# Patient Record
Sex: Male | Born: 1950 | Race: White | Marital: Married | Smoking: Never smoker
Health system: Southern US, Community
[De-identification: ages and names within clinical notes are randomized; demographics above are authoritative.]

## PROBLEM LIST (undated history)

## (undated) DIAGNOSIS — I10 Essential (primary) hypertension: Secondary | ICD-10-CM

## (undated) DIAGNOSIS — E7800 Pure hypercholesterolemia, unspecified: Secondary | ICD-10-CM

## (undated) HISTORY — DX: Pure hypercholesterolemia, unspecified: E78.00

## (undated) HISTORY — DX: Essential (primary) hypertension: I10

## (undated) HISTORY — PX: FRACTURE SURGERY: SHX138

## (undated) HISTORY — PX: SPLENECTOMY, TOTAL: SHX788

## (undated) HISTORY — PX: APPENDECTOMY: SHX54

## (undated) HISTORY — PX: HERNIA REPAIR: SHX51

## (undated) HISTORY — PX: EYE SURGERY: SHX253

---

## 2012-11-25 ENCOUNTER — Ambulatory Visit (INDEPENDENT_AMBULATORY_CARE_PROVIDER_SITE_OTHER): Payer: BC Managed Care – PPO | Admitting: Family Medicine

## 2012-11-25 VITALS — BP 139/73 | HR 68 | Temp 98.7°F | Resp 17 | Ht 74.5 in | Wt 230.0 lb

## 2012-11-25 DIAGNOSIS — A779 Spotted fever, unspecified: Secondary | ICD-10-CM

## 2012-11-25 DIAGNOSIS — L0291 Cutaneous abscess, unspecified: Secondary | ICD-10-CM

## 2012-11-25 DIAGNOSIS — R52 Pain, unspecified: Secondary | ICD-10-CM

## 2012-11-25 MED ORDER — DOXYCYCLINE HYCLATE 100 MG PO TABS: 100.0000 mg | ORAL_TABLET | Freq: Two times a day (BID) | ORAL | Status: DC

## 2012-11-25 NOTE — Progress Notes (Signed)
  [facility]:  Office Visit  Chief Complaint:  Chief Complaint  Patient presents with  . Boil on neck    2 days    HPI: Corey Craig is a 61 y.o. male who complains of 2-4 days h/o boil on neck. Denies fevers, chills. Was infected hair, mashed it out and tried to get out the pus but would not break open. Friday and Saturday hot and tight and today is smaller. Has not had any drainage, has not tried warm compresses. Tried Advil with some relief.  History reviewed. No pertinent past medical history. Past Surgical History  Procedure Date  . Appendectomy   . Cholecystectomy   . Hernia repair   . Fracture surgery    History   Social History  . Marital Status: Married    Spouse Name: N/A    Number of Children: N/A  . Years of Education: N/A   Social History Main Topics  . Smoking status: Never Smoker   . Smokeless tobacco: None  . Alcohol Use: Yes  . Drug Use: No  . Sexually Active: Yes    Birth Control/ Protection: None   Other Topics Concern  . None   Social History Narrative  . None   History reviewed. No pertinent family history. No Known Allergies Prior to Admission medications   Medication Sig Start Date End Date Taking? Authorizing Provider  atorvastatin (LIPITOR) 20 MG tablet Take 20 mg by mouth daily.   Yes Historical Provider, MD  lisinopril (PRINIVIL,ZESTRIL) 20 MG tablet Take 20 mg by mouth daily.   Yes Historical Provider, MD     ROS: The patient denies fevers, chills, night sweats, unintentional weight loss, chest pain, palpitations, wheezing, dyspnea on exertion, nausea, vomiting, abdominal pain, dysuria, hematuria, melena, numbness, weakness, or tingling.   All other systems have been reviewed and were otherwise negative with the exception of those mentioned in the HPI and as above.    PHYSICAL EXAM: Filed Vitals:   11/25/12 1341  BP: 139/73  Pulse: 68  Temp: 98.7 F (37.1 C)  Resp: 17   Filed Vitals:   11/25/12 1341   Height: 6' 2.5" (1.892 m)  Weight: 230 lb (104.327 kg)   Body mass index is 29.14 kg/(m^2).  General: Alert, no acute distress HEENT:  Normocephalic, atraumatic, oropharynx patent.  Cardiovascular:  Regular rate and rhythm, no rubs murmurs or gallops.  No Carotid bruits, radial pulse intact. No pedal edema.  Respiratory: Clear to auscultation bilaterally.  No wheezes, rales, or rhonchi.  No cyanosis, no use of accessory musculature GI: No organomegaly, abdomen is soft and non-tender, positive bowel sounds.  No masses. Skin: + erythema, warm, 2.5 x 3.5  Cm lesion on left parspinal neck msk. Neurologic: Facial musculature symmetric. Psychiatric: Patient is appropriate throughout our interaction. Lymphatic: No cervical lymphadenopathy Musculoskeletal: Gait intact.   LABS: No results found for this or any previous visit.   EKG/XRAY:   Primary read interpreted by Dr. Conley Rolls at [facility].   ASSESSMENT/PLAN: Encounter Diagnosis  Name Primary?  . Cellulitis and abscess Yes   Rx Doxycycline 100 mg BID x 10 days Wound cx pending Wound care and f/u as directed Tylenol and/or Advil prn F/u prn     ,  PHUONG, DO 11/25/2012 2:37 PM

## 2012-11-25 NOTE — Progress Notes (Signed)
Patient ID: Corey Craig MRN: [id number], DOB: Jul 31, 1951, 61 y.o. Date of Encounter: 11/25/2012, 3:12 PM    PROCEDURE NOTE: Verbal consent obtained. Betadine prep per usual protocol. Local anesthesia obtained with 2% lidocaine plain.  1 cm incision made with 11 blade along lesion.  Culture taken. Copious sebaceous material expressed. Lesion explored revealing no loculations. Packed with 1/4 plain packing.  Dressed. Wound care instructions including precautions with patient. Patient tolerated the procedure well. Recheck in 72 hours.      [name], PA-C 11/25/2012 3:12 PM

## 2012-11-28 ENCOUNTER — Ambulatory Visit (INDEPENDENT_AMBULATORY_CARE_PROVIDER_SITE_OTHER): Payer: BC Managed Care – PPO | Admitting: Physician Assistant

## 2012-11-28 ENCOUNTER — Telehealth: Payer: Self-pay | Admitting: Radiology

## 2012-11-28 VITALS — BP 122/76 | HR 80 | Temp 98.1°F | Resp 16 | Ht 74.5 in | Wt 236.2 lb

## 2012-11-28 DIAGNOSIS — L723 Sebaceous cyst: Secondary | ICD-10-CM

## 2012-11-28 LAB — WOUND CULTURE
Gram Stain: NONE SEEN
Gram Stain: NONE SEEN
Gram Stain: NONE SEEN
Organism ID, Bacteria: NO GROWTH

## 2012-11-28 NOTE — Telephone Encounter (Signed)
Message copied by Caffie Damme on Thu Nov 28, 2012  9:48 AM ------      Message from: Hamilton Capri P      Created: Wed Nov 27, 2012  2:50 PM       Wound culture negative, continue with doxycycline. F/u on Thursday

## 2012-11-28 NOTE — Telephone Encounter (Signed)
Called patient to advise left message

## 2012-11-28 NOTE — Progress Notes (Signed)
Patient ID: JAMAURI KRUZEL MRN: [id number], DOB: [birth year] 61 y.o. Date of Encounter: 11/28/2012, 4:05 PM  Chief Complaint: Wound care   See previous note  HPI: 61 y.o. y/o male presents for wound care s/p I&D on 11/26/12 Doing well No issues or complaints Afebrile/ no chills No nausea or vomiting Tolerating doxycycline. Pain improved. States area is still hard with a palpable lump. Daily dressing change Previous note reviewed  No past medical history on file.   Home Meds: Prior to Admission medications   Medication Sig Start Date End Date Taking? Authorizing Provider  atorvastatin (LIPITOR) 20 MG tablet Take 20 mg by mouth daily.   Yes Historical Provider, MD  doxycycline (VIBRA-TABS) 100 MG tablet Take 1 tablet (100 mg total) by mouth 2 (two) times daily. 11/25/12  Yes Thao P Le, DO  lisinopril (PRINIVIL,ZESTRIL) 20 MG tablet Take 20 mg by mouth daily.   Yes Historical Provider, MD    Allergies: No Known Allergies  ROS: Constitutional: Afebrile, no chills Cardiovascular: negative for chest pain or palpitations Dermatological: Positive for wound. Negative for erythema, pain, or warmth.   GI: No nausea or vomiting   EXAM: Physical Exam: Blood pressure 122/76, pulse 80, temperature 98.1 F (36.7 C), temperature source Oral, resp. rate 16, height 6' 2.5" (1.892 m), weight 236 lb 3.2 oz (107.14 kg), SpO2 96.00%., Body mass index is 29.92 kg/(m^2). General: Well developed, well nourished, in no acute distress. Nontoxic appearing. Head: Normocephalic, atraumatic, sclera non-icteric.  Neck: Supple. Lungs: Breathing is unlabored. Heart: Normal rate. Skin:  Warm and moist. Dressing and packing in place. Minimal surrounding induration. No erythema or tenderness to palpation. Neuro: Alert and oriented X 3. Moves all extremities spontaneously. Normal gait.  Psych:  Responds to questions appropriately with a normal affect.       PROCEDURE: Dressing removed. No packing due  to it had fallen out yesterday.  Expressed copious purulent drainage and sebaceous material.  Wound bed healthy Irrigated with 1% plain lidocaine 5 cc. Repacked with 1/4 plain packing. Dressing applied  LAB: Culture: no growth.  A/P: 61 y.o. y/o male with neck sebaceous cyst above s/p I&D on 11/26/12. Wound care per above Continue doxycycline. Pain well controlled Daily dressing changes Recheck 48 hours if needed.   [name], PA-C 11/28/2012 4:05 PM

## 2012-11-30 ENCOUNTER — Ambulatory Visit (INDEPENDENT_AMBULATORY_CARE_PROVIDER_SITE_OTHER): Payer: BC Managed Care – PPO | Admitting: Physician Assistant

## 2012-11-30 VITALS — BP 153/77 | HR 78 | Temp 98.5°F | Resp 17 | Ht 74.5 in | Wt 233.0 lb

## 2012-11-30 DIAGNOSIS — L0211 Cutaneous abscess of neck: Secondary | ICD-10-CM

## 2012-11-30 DIAGNOSIS — L03221 Cellulitis of neck: Secondary | ICD-10-CM

## 2012-11-30 NOTE — Progress Notes (Signed)
  Subjective:    Patient ID: Corey Craig, male    DOB: [birth year], 61 y.o.   MRN: [id number]  HPI 61 yr old CM presents for a recheck of an abscess that was I&D 11/25/2012.  He says no longer painful and not draining now.  He has not had any fevers.  Review of Systems  All other systems reviewed and are negative.       Objective:   Physical Exam  Nursing note and vitals reviewed. Constitutional: He appears well-developed and well-nourished.  Cardiovascular: Normal rate.   Pulmonary/Chest: Effort normal.  Skin:       Back of neck, bandaid removed. No active draining or purulence expressed. No induration or erythema. There is still swelling and a little fluctuance, but it is non-tender.        Assessment & Plan:  Abscess-nape of neck-much improved. Continue/finish antibiotics.

## 2016-02-28 ENCOUNTER — Ambulatory Visit (HOSPITAL_COMMUNITY): Payer: BLUE CROSS/BLUE SHIELD

## 2016-02-28 ENCOUNTER — Other Ambulatory Visit: Payer: Self-pay | Admitting: Internal Medicine

## 2016-02-28 DIAGNOSIS — R42 Dizziness and giddiness: Secondary | ICD-10-CM

## 2016-03-03 ENCOUNTER — Other Ambulatory Visit: Payer: Self-pay

## 2016-03-05 ENCOUNTER — Ambulatory Visit
Admission: RE | Admit: 2016-03-05 | Discharge: 2016-03-05 | Disposition: A | Discharge status: Home / Self Care | Payer: BLUE CROSS/BLUE SHIELD | Source: Ambulatory Visit | Attending: Internal Medicine | Admitting: Internal Medicine

## 2016-03-05 DIAGNOSIS — R42 Dizziness and giddiness: Secondary | ICD-10-CM

## 2016-10-10 ENCOUNTER — Encounter: Payer: Self-pay | Admitting: Neurology

## 2016-10-10 ENCOUNTER — Ambulatory Visit (INDEPENDENT_AMBULATORY_CARE_PROVIDER_SITE_OTHER): Payer: BLUE CROSS/BLUE SHIELD | Admitting: Neurology

## 2016-10-10 VITALS — BP 150/94 | HR 66 | Ht 76.0 in | Wt 231.8 lb

## 2016-10-10 DIAGNOSIS — R42 Dizziness and giddiness: Secondary | ICD-10-CM

## 2016-10-10 NOTE — Progress Notes (Signed)
Reason for visit: Vertigo  Referring physician: Dr. Delice Lesch Corey Craig is a 65 y.o. male  History of present illness:  Corey Craig is a 65 year old right-handed white male with a history of onset of dizziness that occurred around [place] in March 2017. The patient was watching TV, he stood up and had onset of vertigo suddenly. He sat down, but he did not feel completely normal for about 30 minutes. The patient went to take a shower, and felt dizziness again associated with vertigo, over the next 2 weeks, he had some vertigo intermittently associated with sweats at times. The patient did not have overt nausea or vomiting. The patient would note particularly that if he turns his head side to side, he would get dizzy. He states still he felt better. The patient was seen by his primary care physician, he was given meclizine, but he could not tolerate the side effects and he stopped the medication. MRI of the brain was done, this was unremarkable. The patient reported no other associated symptoms of headache, neck discomfort, ear pain, changes in hearing, ringing in the ears. The patient denied any numbness or weakness of the face, arms, or legs. The patient denied any double vision, loss of vision, blurred vision, changes in speech or swallowing, or syncope. The patient utilized the Epley maneuvers, this seemed to help his symptoms, and he continued to improve until about 2 months ago when he plateaued with the dizziness. The patient may have good days and bad days with his symptoms, at times he feels slightly off balance when he looks down with walking. Other days, he feels normal. The patient is sent to this office for an evaluation. The patient denies any medication changes around the time of onset of symptoms. He may have had a cold around the time of symptom onset.  Past Medical History:  Diagnosis Date  . High cholesterol   . Hypertension     Past Surgical History:    Procedure Laterality Date  . APPENDECTOMY    . EYE SURGERY    . FRACTURE SURGERY    . HERNIA REPAIR    . SPLENECTOMY, TOTAL      Family History  Problem Relation Age of Onset  . Hypertension Mother   . Breast cancer Mother   . Heart disease Father     Social history:  reports that he has never smoked. He has quit using smokeless tobacco. He reports that he drinks alcohol. He reports that he does not use drugs.  Medications:  Prior to Admission medications   Medication Sig Start Date End Date Taking? Authorizing Provider  Ascorbic Acid (VITAMIN C) 250 MG CHEW Chew 1 tablet by mouth daily.   Yes Historical Provider, MD  atorvastatin (LIPITOR) 40 MG tablet Take 40 mg by mouth daily. 09/12/16  Yes Historical Provider, MD  Cholecalciferol (VITAMIN D3) 2000 units TABS Take 1 tablet by mouth daily.   Yes Historical Provider, MD  lisinopril (PRINIVIL,ZESTRIL) 10 MG tablet Take 10 mg by mouth daily. 09/12/16  Yes Historical Provider, MD  Multiple Vitamin (MULTIVITAMIN) tablet Take 1 tablet by mouth daily.   Yes Historical Provider, MD     No Known Allergies  ROS:  Out of a complete 14 system review of symptoms, the patient complains only of the following symptoms, and all other reviewed systems are negative.  Dizziness, vertigo  Blood pressure (!) 150/94, pulse 66, height 6\' 4"  (1.93 m), weight 231 lb 12 oz (  105.1 kg).  Physical Exam  General: The patient is alert and cooperative at the time of the examination.  Eyes: Pupils are equal, round, and reactive to light. Discs are flat bilaterally.  Neck: The neck is supple, no carotid bruits are noted.  Ears: Tympanic membranes are clear bilaterally.  Respiratory: The respiratory examination is clear.  Cardiovascular: The cardiovascular examination reveals a regular rate and rhythm, no obvious murmurs or rubs are noted.  Skin: Extremities are without significant edema.  Neurologic Exam  Mental status: The patient is alert and  oriented x 3 at the time of the examination. The patient has apparent normal recent and remote memory, with an apparently normal attention span and concentration ability.  Cranial nerves: Facial symmetry is present. There is good sensation of the face to pinprick and soft touch bilaterally. The strength of the facial muscles and the muscles to head turning and shoulder shrug are normal bilaterally. Speech is well enunciated, no aphasia or dysarthria is noted. Extraocular movements are full. Visual fields are full. The tongue is midline, and the patient has symmetric elevation of the soft palate. No obvious hearing deficits are noted.  Motor: The motor testing reveals 5 over 5 strength of all 4 extremities. Good symmetric motor tone is noted throughout.  Sensory: Sensory testing is intact to pinprick, soft touch, vibration sensation, and position sense on all 4 extremities. No evidence of extinction is noted.  Coordination: Cerebellar testing reveals good finger-nose-finger and heel-to-shin bilaterally. The Nyan-Barrany procedure reveals some bilateral horizontal and rotatory end-gaze nystagmus, minimal symptoms noted by the patient.  Gait and station: Gait is normal. Tandem gait is normal. Romberg is negative. No drift is seen.  Reflexes: Deep tendon reflexes are symmetric and normal bilaterally. Toes are downgoing bilaterally.   MRI brain 03/05/16:  IMPRESSION: 1.  No acute intracranial abnormality. 2. Generalized intracranial artery dolichoectasia, raising possibility of chronic hypertension. 3. Otherwise normal for age noncontrast MRI appearance of the brain.  * MRI scan images were reviewed online. I agree with the written report.    Assessment/Plan:  1. Vertigo  The patient appears to have symptoms consistent with an inner ear process. The patient may have had a mild vestibulitis or positional vertigo. The patient still has some ongoing symptoms, the Epley maneuvers have been  helpful, this should be maintained 2 or 3 times a week to continue to help symptoms. The patient is almost back to his baseline, but not quite. There is no evidence of a central nervous system process. I would not recommend any medication for the patient at this time. He will follow-up through this office if needed.  Jill Alexanders MD 10/10/2016 8:44 AM  [facility] [address] [place], [place] [address]  Phone [phone] Fax [phone]

## 2018-03-14 DIAGNOSIS — H11051 Peripheral pterygium, progressive, right eye: Secondary | ICD-10-CM | POA: Diagnosis not present

## 2018-03-14 DIAGNOSIS — H11001 Unspecified pterygium of right eye: Secondary | ICD-10-CM | POA: Diagnosis not present

## 2018-07-23 DIAGNOSIS — K623 Rectal prolapse: Secondary | ICD-10-CM | POA: Diagnosis not present

## 2018-07-23 DIAGNOSIS — K644 Residual hemorrhoidal skin tags: Secondary | ICD-10-CM | POA: Diagnosis not present

## 2018-07-29 DIAGNOSIS — K643 Fourth degree hemorrhoids: Secondary | ICD-10-CM | POA: Diagnosis not present

## 2018-08-15 DIAGNOSIS — D2262 Melanocytic nevi of left upper limb, including shoulder: Secondary | ICD-10-CM | POA: Diagnosis not present

## 2018-08-15 DIAGNOSIS — B9689 Other specified bacterial agents as the cause of diseases classified elsewhere: Secondary | ICD-10-CM | POA: Diagnosis not present

## 2018-08-15 DIAGNOSIS — L089 Local infection of the skin and subcutaneous tissue, unspecified: Secondary | ICD-10-CM | POA: Diagnosis not present

## 2018-08-21 DIAGNOSIS — Z125 Encounter for screening for malignant neoplasm of prostate: Secondary | ICD-10-CM | POA: Diagnosis not present

## 2018-08-21 DIAGNOSIS — R739 Hyperglycemia, unspecified: Secondary | ICD-10-CM | POA: Diagnosis not present

## 2018-08-21 DIAGNOSIS — E785 Hyperlipidemia, unspecified: Secondary | ICD-10-CM | POA: Diagnosis not present

## 2018-08-21 DIAGNOSIS — Z23 Encounter for immunization: Secondary | ICD-10-CM | POA: Diagnosis not present

## 2018-08-21 DIAGNOSIS — K641 Second degree hemorrhoids: Secondary | ICD-10-CM | POA: Diagnosis not present

## 2018-08-21 DIAGNOSIS — I10 Essential (primary) hypertension: Secondary | ICD-10-CM | POA: Diagnosis not present

## 2018-08-21 DIAGNOSIS — Z1159 Encounter for screening for other viral diseases: Secondary | ICD-10-CM | POA: Diagnosis not present

## 2018-08-21 DIAGNOSIS — Z1339 Encounter for screening examination for other mental health and behavioral disorders: Secondary | ICD-10-CM | POA: Diagnosis not present

## 2018-08-21 DIAGNOSIS — Z0000 Encounter for general adult medical examination without abnormal findings: Secondary | ICD-10-CM | POA: Diagnosis not present

## 2018-08-21 DIAGNOSIS — Z9081 Acquired absence of spleen: Secondary | ICD-10-CM | POA: Diagnosis not present

## 2018-08-21 DIAGNOSIS — L989 Disorder of the skin and subcutaneous tissue, unspecified: Secondary | ICD-10-CM | POA: Diagnosis not present

## 2018-08-21 DIAGNOSIS — E669 Obesity, unspecified: Secondary | ICD-10-CM | POA: Diagnosis not present

## 2018-09-06 DIAGNOSIS — B078 Other viral warts: Secondary | ICD-10-CM | POA: Diagnosis not present

## 2018-09-06 DIAGNOSIS — L821 Other seborrheic keratosis: Secondary | ICD-10-CM | POA: Diagnosis not present

## 2018-09-06 DIAGNOSIS — C44629 Squamous cell carcinoma of skin of left upper limb, including shoulder: Secondary | ICD-10-CM | POA: Diagnosis not present

## 2018-09-06 DIAGNOSIS — K641 Second degree hemorrhoids: Secondary | ICD-10-CM | POA: Diagnosis not present

## 2018-09-06 DIAGNOSIS — D225 Melanocytic nevi of trunk: Secondary | ICD-10-CM | POA: Diagnosis not present

## 2018-09-24 DIAGNOSIS — H43813 Vitreous degeneration, bilateral: Secondary | ICD-10-CM | POA: Diagnosis not present

## 2018-09-24 DIAGNOSIS — H33301 Unspecified retinal break, right eye: Secondary | ICD-10-CM | POA: Diagnosis not present

## 2018-09-24 DIAGNOSIS — H2513 Age-related nuclear cataract, bilateral: Secondary | ICD-10-CM | POA: Diagnosis not present

## 2018-10-11 DIAGNOSIS — L578 Other skin changes due to chronic exposure to nonionizing radiation: Secondary | ICD-10-CM | POA: Diagnosis not present

## 2018-10-11 DIAGNOSIS — Z85828 Personal history of other malignant neoplasm of skin: Secondary | ICD-10-CM | POA: Diagnosis not present

## 2018-10-11 DIAGNOSIS — Z08 Encounter for follow-up examination after completed treatment for malignant neoplasm: Secondary | ICD-10-CM | POA: Diagnosis not present

## 2018-10-15 DIAGNOSIS — H1033 Unspecified acute conjunctivitis, bilateral: Secondary | ICD-10-CM | POA: Diagnosis not present

## 2019-02-25 DIAGNOSIS — E669 Obesity, unspecified: Secondary | ICD-10-CM | POA: Diagnosis not present

## 2019-02-25 DIAGNOSIS — I10 Essential (primary) hypertension: Secondary | ICD-10-CM | POA: Diagnosis not present

## 2019-02-25 DIAGNOSIS — E785 Hyperlipidemia, unspecified: Secondary | ICD-10-CM | POA: Diagnosis not present

## 2019-04-11 DIAGNOSIS — L570 Actinic keratosis: Secondary | ICD-10-CM | POA: Diagnosis not present

## 2019-04-11 DIAGNOSIS — L255 Unspecified contact dermatitis due to plants, except food: Secondary | ICD-10-CM | POA: Diagnosis not present

## 2019-04-11 DIAGNOSIS — D225 Melanocytic nevi of trunk: Secondary | ICD-10-CM | POA: Diagnosis not present

## 2019-04-11 DIAGNOSIS — X32XXXA Exposure to sunlight, initial encounter: Secondary | ICD-10-CM | POA: Diagnosis not present

## 2019-04-11 DIAGNOSIS — L821 Other seborrheic keratosis: Secondary | ICD-10-CM | POA: Diagnosis not present

## 2019-04-11 DIAGNOSIS — Z08 Encounter for follow-up examination after completed treatment for malignant neoplasm: Secondary | ICD-10-CM | POA: Diagnosis not present

## 2019-04-11 DIAGNOSIS — Z85828 Personal history of other malignant neoplasm of skin: Secondary | ICD-10-CM | POA: Diagnosis not present

## 2019-08-27 DIAGNOSIS — Z1389 Encounter for screening for other disorder: Secondary | ICD-10-CM | POA: Diagnosis not present

## 2019-08-27 DIAGNOSIS — E785 Hyperlipidemia, unspecified: Secondary | ICD-10-CM | POA: Diagnosis not present

## 2019-08-27 DIAGNOSIS — Z125 Encounter for screening for malignant neoplasm of prostate: Secondary | ICD-10-CM | POA: Diagnosis not present

## 2019-08-27 DIAGNOSIS — Z6831 Body mass index (BMI) 31.0-31.9, adult: Secondary | ICD-10-CM | POA: Diagnosis not present

## 2019-08-27 DIAGNOSIS — Z23 Encounter for immunization: Secondary | ICD-10-CM | POA: Diagnosis not present

## 2019-08-27 DIAGNOSIS — L989 Disorder of the skin and subcutaneous tissue, unspecified: Secondary | ICD-10-CM | POA: Diagnosis not present

## 2019-08-27 DIAGNOSIS — R221 Localized swelling, mass and lump, neck: Secondary | ICD-10-CM | POA: Diagnosis not present

## 2019-08-27 DIAGNOSIS — Z9081 Acquired absence of spleen: Secondary | ICD-10-CM | POA: Diagnosis not present

## 2019-08-27 DIAGNOSIS — J3089 Other allergic rhinitis: Secondary | ICD-10-CM | POA: Diagnosis not present

## 2019-08-27 DIAGNOSIS — Z0000 Encounter for general adult medical examination without abnormal findings: Secondary | ICD-10-CM | POA: Diagnosis not present

## 2019-08-27 DIAGNOSIS — I10 Essential (primary) hypertension: Secondary | ICD-10-CM | POA: Diagnosis not present

## 2019-09-12 DIAGNOSIS — X32XXXD Exposure to sunlight, subsequent encounter: Secondary | ICD-10-CM | POA: Diagnosis not present

## 2019-09-12 DIAGNOSIS — L570 Actinic keratosis: Secondary | ICD-10-CM | POA: Diagnosis not present

## 2019-09-30 DIAGNOSIS — H43813 Vitreous degeneration, bilateral: Secondary | ICD-10-CM | POA: Diagnosis not present

## 2019-09-30 DIAGNOSIS — H52203 Unspecified astigmatism, bilateral: Secondary | ICD-10-CM | POA: Diagnosis not present

## 2019-09-30 DIAGNOSIS — H524 Presbyopia: Secondary | ICD-10-CM | POA: Diagnosis not present

## 2019-09-30 DIAGNOSIS — H2513 Age-related nuclear cataract, bilateral: Secondary | ICD-10-CM | POA: Diagnosis not present

## 2019-10-09 ENCOUNTER — Other Ambulatory Visit: Payer: Self-pay | Admitting: Internal Medicine

## 2019-10-09 DIAGNOSIS — R221 Localized swelling, mass and lump, neck: Secondary | ICD-10-CM

## 2019-10-13 ENCOUNTER — Ambulatory Visit
Admission: RE | Admit: 2019-10-13 | Discharge: 2019-10-13 | Disposition: A | Discharge status: Home / Self Care | Payer: Medicare Other | Source: Ambulatory Visit | Attending: Internal Medicine | Admitting: Internal Medicine

## 2019-10-13 DIAGNOSIS — R221 Localized swelling, mass and lump, neck: Secondary | ICD-10-CM

## 2020-03-10 DIAGNOSIS — E785 Hyperlipidemia, unspecified: Secondary | ICD-10-CM | POA: Diagnosis not present

## 2020-03-10 DIAGNOSIS — E669 Obesity, unspecified: Secondary | ICD-10-CM | POA: Diagnosis not present

## 2020-03-10 DIAGNOSIS — I10 Essential (primary) hypertension: Secondary | ICD-10-CM | POA: Diagnosis not present

## 2020-07-05 DIAGNOSIS — G4719 Other hypersomnia: Secondary | ICD-10-CM | POA: Diagnosis not present

## 2020-07-23 DIAGNOSIS — G4733 Obstructive sleep apnea (adult) (pediatric): Secondary | ICD-10-CM | POA: Diagnosis not present

## 2020-08-23 DIAGNOSIS — E669 Obesity, unspecified: Secondary | ICD-10-CM | POA: Diagnosis not present

## 2020-08-23 DIAGNOSIS — Z6831 Body mass index (BMI) 31.0-31.9, adult: Secondary | ICD-10-CM | POA: Diagnosis not present

## 2020-08-23 DIAGNOSIS — G4733 Obstructive sleep apnea (adult) (pediatric): Secondary | ICD-10-CM | POA: Diagnosis not present

## 2020-08-23 DIAGNOSIS — Z0000 Encounter for general adult medical examination without abnormal findings: Secondary | ICD-10-CM | POA: Diagnosis not present

## 2020-08-23 DIAGNOSIS — Z7189 Other specified counseling: Secondary | ICD-10-CM | POA: Diagnosis not present

## 2020-08-23 DIAGNOSIS — Z125 Encounter for screening for malignant neoplasm of prostate: Secondary | ICD-10-CM | POA: Diagnosis not present

## 2020-08-23 DIAGNOSIS — I10 Essential (primary) hypertension: Secondary | ICD-10-CM | POA: Diagnosis not present

## 2020-08-23 DIAGNOSIS — E785 Hyperlipidemia, unspecified: Secondary | ICD-10-CM | POA: Diagnosis not present

## 2020-08-23 DIAGNOSIS — Z1389 Encounter for screening for other disorder: Secondary | ICD-10-CM | POA: Diagnosis not present

## 2020-09-30 DIAGNOSIS — Z23 Encounter for immunization: Secondary | ICD-10-CM | POA: Diagnosis not present

## 2020-10-05 DIAGNOSIS — H524 Presbyopia: Secondary | ICD-10-CM | POA: Diagnosis not present

## 2020-10-05 DIAGNOSIS — H33301 Unspecified retinal break, right eye: Secondary | ICD-10-CM | POA: Diagnosis not present

## 2020-10-05 DIAGNOSIS — H2513 Age-related nuclear cataract, bilateral: Secondary | ICD-10-CM | POA: Diagnosis not present

## 2020-10-05 DIAGNOSIS — H43813 Vitreous degeneration, bilateral: Secondary | ICD-10-CM | POA: Diagnosis not present

## 2020-11-08 DIAGNOSIS — G4733 Obstructive sleep apnea (adult) (pediatric): Secondary | ICD-10-CM | POA: Diagnosis not present

## 2020-12-31 DIAGNOSIS — Z1152 Encounter for screening for COVID-19: Secondary | ICD-10-CM | POA: Diagnosis not present

## 2021-03-23 DIAGNOSIS — E785 Hyperlipidemia, unspecified: Secondary | ICD-10-CM | POA: Diagnosis not present

## 2021-03-23 DIAGNOSIS — R7309 Other abnormal glucose: Secondary | ICD-10-CM | POA: Diagnosis not present

## 2021-04-01 DIAGNOSIS — L049 Acute lymphadenitis, unspecified: Secondary | ICD-10-CM | POA: Diagnosis not present

## 2021-04-01 DIAGNOSIS — R21 Rash and other nonspecific skin eruption: Secondary | ICD-10-CM | POA: Diagnosis not present

## 2021-04-01 DIAGNOSIS — E785 Hyperlipidemia, unspecified: Secondary | ICD-10-CM | POA: Diagnosis not present

## 2021-04-01 DIAGNOSIS — R739 Hyperglycemia, unspecified: Secondary | ICD-10-CM | POA: Diagnosis not present

## 2021-04-05 DIAGNOSIS — Z8616 Personal history of COVID-19: Secondary | ICD-10-CM | POA: Diagnosis not present

## 2021-04-05 DIAGNOSIS — K1121 Acute sialoadenitis: Secondary | ICD-10-CM | POA: Diagnosis not present

## 2021-04-05 DIAGNOSIS — B349 Viral infection, unspecified: Secondary | ICD-10-CM | POA: Diagnosis not present

## 2021-04-07 DIAGNOSIS — B029 Zoster without complications: Secondary | ICD-10-CM | POA: Diagnosis not present

## 2021-04-07 DIAGNOSIS — C4441 Basal cell carcinoma of skin of scalp and neck: Secondary | ICD-10-CM | POA: Diagnosis not present

## 2021-04-07 DIAGNOSIS — Z1283 Encounter for screening for malignant neoplasm of skin: Secondary | ICD-10-CM | POA: Diagnosis not present

## 2021-04-07 DIAGNOSIS — D225 Melanocytic nevi of trunk: Secondary | ICD-10-CM | POA: Diagnosis not present

## 2021-05-19 DIAGNOSIS — L820 Inflamed seborrheic keratosis: Secondary | ICD-10-CM | POA: Diagnosis not present

## 2021-05-19 DIAGNOSIS — Z85828 Personal history of other malignant neoplasm of skin: Secondary | ICD-10-CM | POA: Diagnosis not present

## 2021-05-19 DIAGNOSIS — L821 Other seborrheic keratosis: Secondary | ICD-10-CM | POA: Diagnosis not present

## 2021-05-19 DIAGNOSIS — Z08 Encounter for follow-up examination after completed treatment for malignant neoplasm: Secondary | ICD-10-CM | POA: Diagnosis not present

## 2021-06-22 DIAGNOSIS — R739 Hyperglycemia, unspecified: Secondary | ICD-10-CM | POA: Diagnosis not present

## 2021-06-22 DIAGNOSIS — K1121 Acute sialoadenitis: Secondary | ICD-10-CM | POA: Diagnosis not present

## 2021-06-22 DIAGNOSIS — E785 Hyperlipidemia, unspecified: Secondary | ICD-10-CM | POA: Diagnosis not present

## 2021-06-22 DIAGNOSIS — R7309 Other abnormal glucose: Secondary | ICD-10-CM | POA: Diagnosis not present

## 2021-06-24 DIAGNOSIS — E669 Obesity, unspecified: Secondary | ICD-10-CM | POA: Diagnosis not present

## 2021-06-24 DIAGNOSIS — I10 Essential (primary) hypertension: Secondary | ICD-10-CM | POA: Diagnosis not present

## 2021-06-24 DIAGNOSIS — R7303 Prediabetes: Secondary | ICD-10-CM | POA: Diagnosis not present

## 2021-06-24 DIAGNOSIS — E785 Hyperlipidemia, unspecified: Secondary | ICD-10-CM | POA: Diagnosis not present

## 2021-08-26 DIAGNOSIS — E669 Obesity, unspecified: Secondary | ICD-10-CM | POA: Diagnosis not present

## 2021-08-26 DIAGNOSIS — N529 Male erectile dysfunction, unspecified: Secondary | ICD-10-CM | POA: Diagnosis not present

## 2021-08-26 DIAGNOSIS — Z9081 Acquired absence of spleen: Secondary | ICD-10-CM | POA: Diagnosis not present

## 2021-08-26 DIAGNOSIS — Z1389 Encounter for screening for other disorder: Secondary | ICD-10-CM | POA: Diagnosis not present

## 2021-08-26 DIAGNOSIS — G4733 Obstructive sleep apnea (adult) (pediatric): Secondary | ICD-10-CM | POA: Diagnosis not present

## 2021-08-26 DIAGNOSIS — Z8249 Family history of ischemic heart disease and other diseases of the circulatory system: Secondary | ICD-10-CM | POA: Diagnosis not present

## 2021-08-26 DIAGNOSIS — Z7189 Other specified counseling: Secondary | ICD-10-CM | POA: Diagnosis not present

## 2021-08-26 DIAGNOSIS — Z125 Encounter for screening for malignant neoplasm of prostate: Secondary | ICD-10-CM | POA: Diagnosis not present

## 2021-08-26 DIAGNOSIS — E785 Hyperlipidemia, unspecified: Secondary | ICD-10-CM | POA: Diagnosis not present

## 2021-08-26 DIAGNOSIS — I10 Essential (primary) hypertension: Secondary | ICD-10-CM | POA: Diagnosis not present

## 2021-08-26 DIAGNOSIS — Z0000 Encounter for general adult medical examination without abnormal findings: Secondary | ICD-10-CM | POA: Diagnosis not present

## 2021-10-04 DIAGNOSIS — H43813 Vitreous degeneration, bilateral: Secondary | ICD-10-CM | POA: Diagnosis not present

## 2021-10-04 DIAGNOSIS — H33301 Unspecified retinal break, right eye: Secondary | ICD-10-CM | POA: Diagnosis not present

## 2021-10-04 DIAGNOSIS — H524 Presbyopia: Secondary | ICD-10-CM | POA: Diagnosis not present

## 2021-10-04 DIAGNOSIS — H2513 Age-related nuclear cataract, bilateral: Secondary | ICD-10-CM | POA: Diagnosis not present

## 2021-10-11 DIAGNOSIS — Z23 Encounter for immunization: Secondary | ICD-10-CM | POA: Diagnosis not present

## 2021-11-09 DIAGNOSIS — G4733 Obstructive sleep apnea (adult) (pediatric): Secondary | ICD-10-CM | POA: Diagnosis not present

## 2021-11-17 DIAGNOSIS — L820 Inflamed seborrheic keratosis: Secondary | ICD-10-CM | POA: Diagnosis not present

## 2021-11-17 DIAGNOSIS — D225 Melanocytic nevi of trunk: Secondary | ICD-10-CM | POA: Diagnosis not present

## 2021-11-17 DIAGNOSIS — L821 Other seborrheic keratosis: Secondary | ICD-10-CM | POA: Diagnosis not present

## 2021-11-17 DIAGNOSIS — Z08 Encounter for follow-up examination after completed treatment for malignant neoplasm: Secondary | ICD-10-CM | POA: Diagnosis not present

## 2021-11-17 DIAGNOSIS — Z85828 Personal history of other malignant neoplasm of skin: Secondary | ICD-10-CM | POA: Diagnosis not present

## 2022-02-21 DIAGNOSIS — Z20822 Contact with and (suspected) exposure to covid-19: Secondary | ICD-10-CM | POA: Diagnosis not present

## 2022-02-22 ENCOUNTER — Other Ambulatory Visit: Payer: Self-pay | Admitting: Internal Medicine

## 2022-02-22 ENCOUNTER — Ambulatory Visit
Admission: RE | Admit: 2022-02-22 | Discharge: 2022-02-22 | Disposition: A | Discharge status: Home / Self Care | Payer: Medicare Other | Source: Ambulatory Visit | Attending: Internal Medicine | Admitting: Internal Medicine

## 2022-02-22 DIAGNOSIS — J069 Acute upper respiratory infection, unspecified: Secondary | ICD-10-CM | POA: Diagnosis not present

## 2022-05-18 DIAGNOSIS — Z1283 Encounter for screening for malignant neoplasm of skin: Secondary | ICD-10-CM | POA: Diagnosis not present

## 2022-05-18 DIAGNOSIS — L821 Other seborrheic keratosis: Secondary | ICD-10-CM | POA: Diagnosis not present

## 2022-05-18 DIAGNOSIS — X32XXXD Exposure to sunlight, subsequent encounter: Secondary | ICD-10-CM | POA: Diagnosis not present

## 2022-05-18 DIAGNOSIS — L570 Actinic keratosis: Secondary | ICD-10-CM | POA: Diagnosis not present

## 2022-07-21 ENCOUNTER — Other Ambulatory Visit: Payer: Self-pay | Admitting: Internal Medicine

## 2022-07-21 ENCOUNTER — Ambulatory Visit
Admission: RE | Admit: 2022-07-21 | Discharge: 2022-07-21 | Disposition: A | Discharge status: Home / Self Care | Payer: Medicare Other | Source: Ambulatory Visit | Attending: Internal Medicine | Admitting: Internal Medicine

## 2022-07-21 DIAGNOSIS — M25572 Pain in left ankle and joints of left foot: Secondary | ICD-10-CM | POA: Diagnosis not present

## 2022-07-21 DIAGNOSIS — M19071 Primary osteoarthritis, right ankle and foot: Secondary | ICD-10-CM | POA: Diagnosis not present

## 2022-07-21 DIAGNOSIS — E669 Obesity, unspecified: Secondary | ICD-10-CM | POA: Diagnosis not present

## 2022-07-21 DIAGNOSIS — I10 Essential (primary) hypertension: Secondary | ICD-10-CM | POA: Diagnosis not present

## 2022-07-21 DIAGNOSIS — M25571 Pain in right ankle and joints of right foot: Secondary | ICD-10-CM

## 2022-07-21 DIAGNOSIS — M7989 Other specified soft tissue disorders: Secondary | ICD-10-CM | POA: Diagnosis not present

## 2022-07-21 DIAGNOSIS — R7303 Prediabetes: Secondary | ICD-10-CM | POA: Diagnosis not present

## 2022-07-21 DIAGNOSIS — G629 Polyneuropathy, unspecified: Secondary | ICD-10-CM | POA: Diagnosis not present

## 2022-07-21 DIAGNOSIS — G8929 Other chronic pain: Secondary | ICD-10-CM | POA: Diagnosis not present

## 2022-07-21 DIAGNOSIS — M19072 Primary osteoarthritis, left ankle and foot: Secondary | ICD-10-CM | POA: Diagnosis not present

## 2022-07-21 DIAGNOSIS — R7309 Other abnormal glucose: Secondary | ICD-10-CM | POA: Diagnosis not present

## 2022-08-28 DIAGNOSIS — E669 Obesity, unspecified: Secondary | ICD-10-CM | POA: Diagnosis not present

## 2022-08-28 DIAGNOSIS — E8881 Metabolic syndrome: Secondary | ICD-10-CM | POA: Diagnosis not present

## 2022-08-28 DIAGNOSIS — E785 Hyperlipidemia, unspecified: Secondary | ICD-10-CM | POA: Diagnosis not present

## 2022-08-28 DIAGNOSIS — R7303 Prediabetes: Secondary | ICD-10-CM | POA: Diagnosis not present

## 2022-08-28 DIAGNOSIS — Z9081 Acquired absence of spleen: Secondary | ICD-10-CM | POA: Diagnosis not present

## 2022-08-28 DIAGNOSIS — Z1211 Encounter for screening for malignant neoplasm of colon: Secondary | ICD-10-CM | POA: Diagnosis not present

## 2022-08-28 DIAGNOSIS — Z0000 Encounter for general adult medical examination without abnormal findings: Secondary | ICD-10-CM | POA: Diagnosis not present

## 2022-08-28 DIAGNOSIS — Z125 Encounter for screening for malignant neoplasm of prostate: Secondary | ICD-10-CM | POA: Diagnosis not present

## 2022-08-28 DIAGNOSIS — N529 Male erectile dysfunction, unspecified: Secondary | ICD-10-CM | POA: Diagnosis not present

## 2022-08-28 DIAGNOSIS — H9190 Unspecified hearing loss, unspecified ear: Secondary | ICD-10-CM | POA: Diagnosis not present

## 2022-08-28 DIAGNOSIS — Z1331 Encounter for screening for depression: Secondary | ICD-10-CM | POA: Diagnosis not present

## 2022-08-28 DIAGNOSIS — G4733 Obstructive sleep apnea (adult) (pediatric): Secondary | ICD-10-CM | POA: Diagnosis not present

## 2022-09-23 DIAGNOSIS — Z23 Encounter for immunization: Secondary | ICD-10-CM | POA: Diagnosis not present

## 2022-10-10 DIAGNOSIS — H04123 Dry eye syndrome of bilateral lacrimal glands: Secondary | ICD-10-CM | POA: Diagnosis not present

## 2022-10-10 DIAGNOSIS — H5213 Myopia, bilateral: Secondary | ICD-10-CM | POA: Diagnosis not present

## 2022-10-10 DIAGNOSIS — H524 Presbyopia: Secondary | ICD-10-CM | POA: Diagnosis not present

## 2022-10-10 DIAGNOSIS — H43813 Vitreous degeneration, bilateral: Secondary | ICD-10-CM | POA: Diagnosis not present

## 2022-10-10 DIAGNOSIS — H33301 Unspecified retinal break, right eye: Secondary | ICD-10-CM | POA: Diagnosis not present

## 2022-10-10 DIAGNOSIS — H2513 Age-related nuclear cataract, bilateral: Secondary | ICD-10-CM | POA: Diagnosis not present

## 2022-10-10 DIAGNOSIS — H353132 Nonexudative age-related macular degeneration, bilateral, intermediate dry stage: Secondary | ICD-10-CM | POA: Diagnosis not present

## 2022-10-17 DIAGNOSIS — H3530 Unspecified macular degeneration: Secondary | ICD-10-CM | POA: Diagnosis not present

## 2022-10-17 DIAGNOSIS — M62830 Muscle spasm of back: Secondary | ICD-10-CM | POA: Diagnosis not present

## 2022-10-17 DIAGNOSIS — G629 Polyneuropathy, unspecified: Secondary | ICD-10-CM | POA: Diagnosis not present

## 2022-10-17 DIAGNOSIS — J9801 Acute bronchospasm: Secondary | ICD-10-CM | POA: Diagnosis not present

## 2022-11-08 DIAGNOSIS — G4733 Obstructive sleep apnea (adult) (pediatric): Secondary | ICD-10-CM | POA: Diagnosis not present

## 2022-11-16 DIAGNOSIS — L820 Inflamed seborrheic keratosis: Secondary | ICD-10-CM | POA: Diagnosis not present

## 2022-11-16 DIAGNOSIS — X32XXXD Exposure to sunlight, subsequent encounter: Secondary | ICD-10-CM | POA: Diagnosis not present

## 2022-11-16 DIAGNOSIS — B9689 Other specified bacterial agents as the cause of diseases classified elsewhere: Secondary | ICD-10-CM | POA: Diagnosis not present

## 2022-11-16 DIAGNOSIS — L570 Actinic keratosis: Secondary | ICD-10-CM | POA: Diagnosis not present

## 2022-11-16 DIAGNOSIS — L0202 Furuncle of face: Secondary | ICD-10-CM | POA: Diagnosis not present

## 2023-02-26 DIAGNOSIS — E669 Obesity, unspecified: Secondary | ICD-10-CM | POA: Diagnosis not present

## 2023-02-26 DIAGNOSIS — E785 Hyperlipidemia, unspecified: Secondary | ICD-10-CM | POA: Diagnosis not present

## 2023-02-26 DIAGNOSIS — G4733 Obstructive sleep apnea (adult) (pediatric): Secondary | ICD-10-CM | POA: Diagnosis not present

## 2023-02-26 DIAGNOSIS — I10 Essential (primary) hypertension: Secondary | ICD-10-CM | POA: Diagnosis not present

## 2023-02-26 DIAGNOSIS — R7303 Prediabetes: Secondary | ICD-10-CM | POA: Diagnosis not present

## 2023-05-17 DIAGNOSIS — B9689 Other specified bacterial agents as the cause of diseases classified elsewhere: Secondary | ICD-10-CM | POA: Diagnosis not present

## 2023-05-17 DIAGNOSIS — L820 Inflamed seborrheic keratosis: Secondary | ICD-10-CM | POA: Diagnosis not present

## 2023-05-17 DIAGNOSIS — L570 Actinic keratosis: Secondary | ICD-10-CM | POA: Diagnosis not present

## 2023-05-17 DIAGNOSIS — L0202 Furuncle of face: Secondary | ICD-10-CM | POA: Diagnosis not present

## 2023-05-17 DIAGNOSIS — Z1283 Encounter for screening for malignant neoplasm of skin: Secondary | ICD-10-CM | POA: Diagnosis not present

## 2023-05-17 DIAGNOSIS — X32XXXD Exposure to sunlight, subsequent encounter: Secondary | ICD-10-CM | POA: Diagnosis not present

## 2023-05-17 DIAGNOSIS — D225 Melanocytic nevi of trunk: Secondary | ICD-10-CM | POA: Diagnosis not present

## 2023-05-28 IMAGING — DX DG CHEST 2V
2 series · 2 of 2 positions shown · non-contrast
Comparison: None.

CLINICAL DATA: Upper respiratory tract infection.

EXAM:
CHEST - 2 VIEW

[dg chest 2 view (1 of 2)]
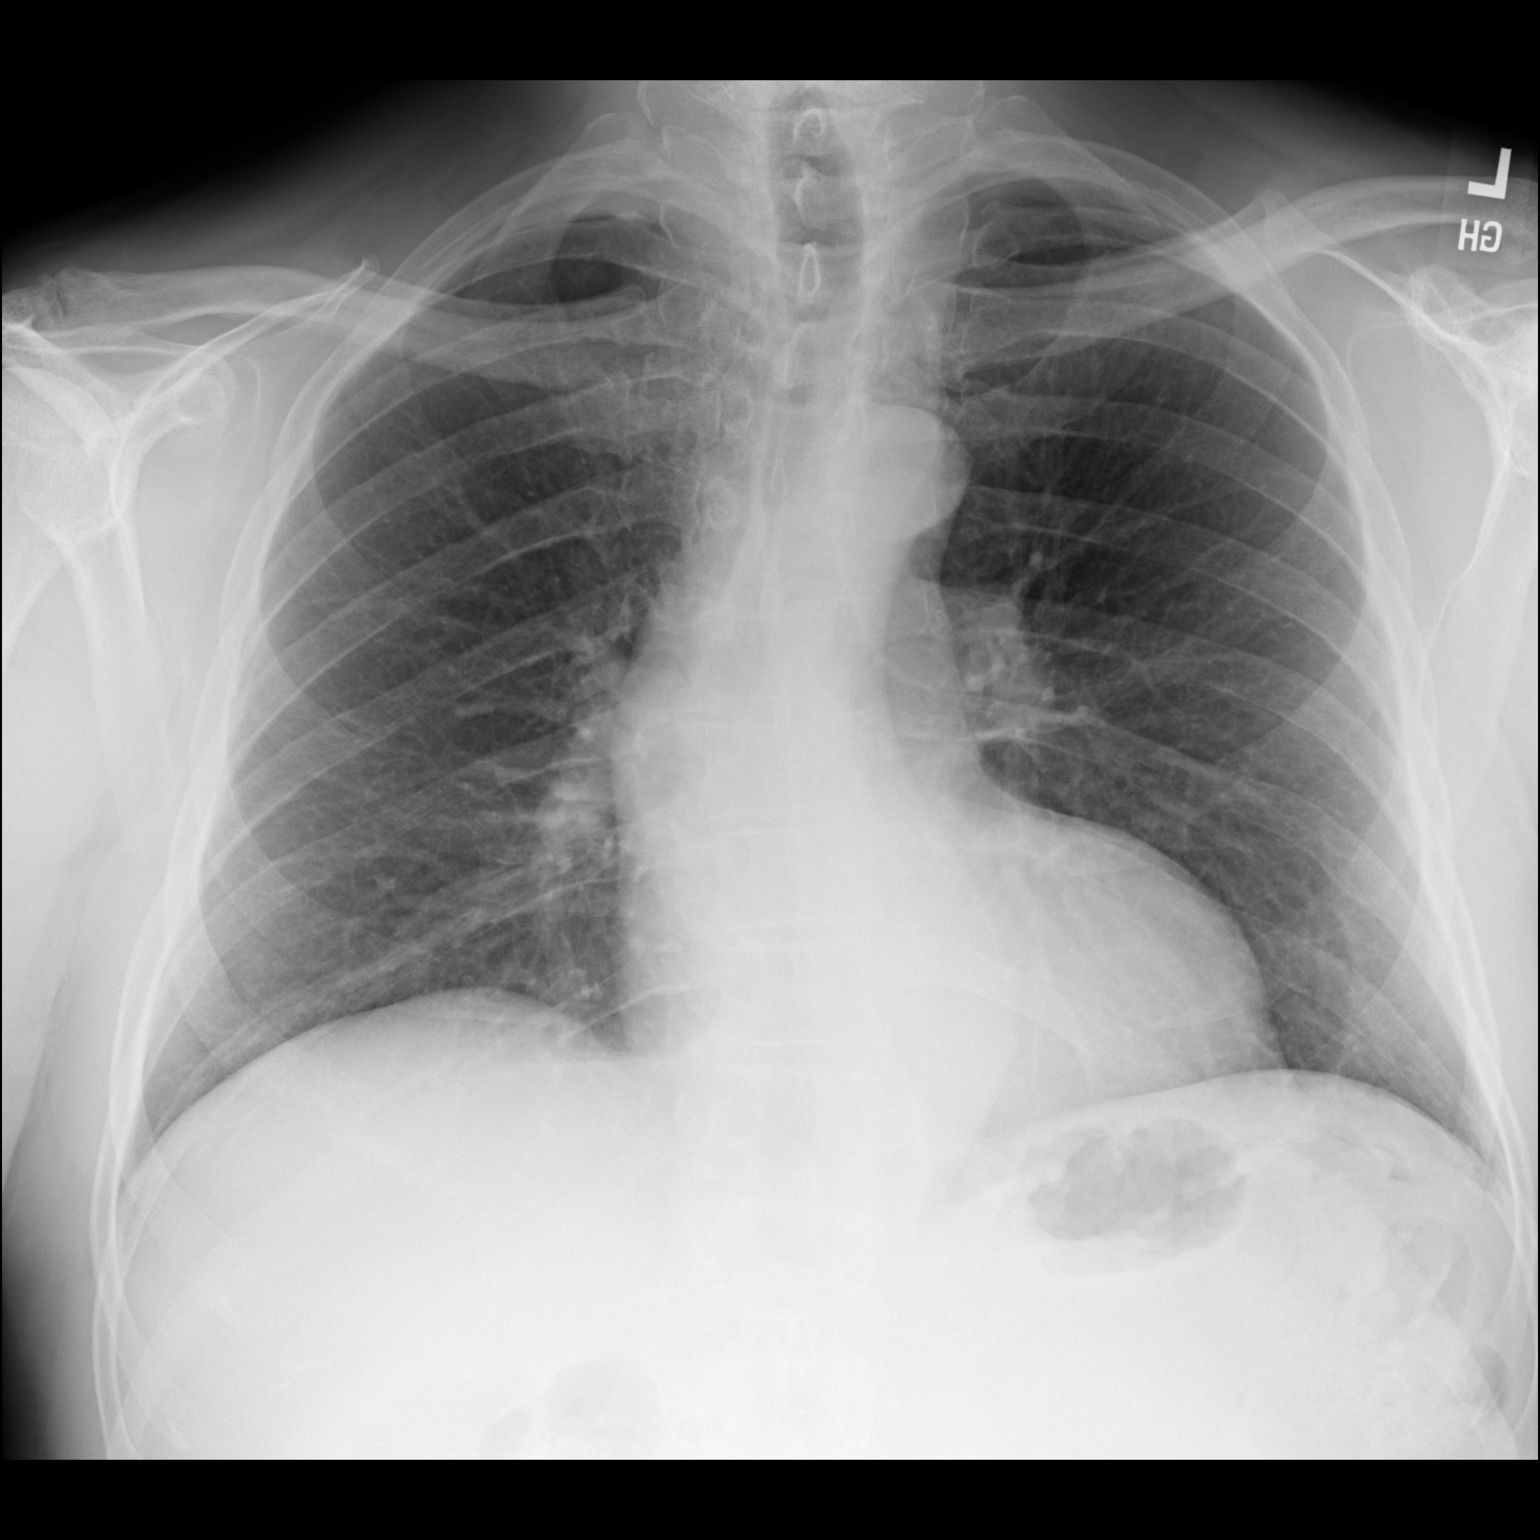

[dg chest 2 view (2 of 2)]
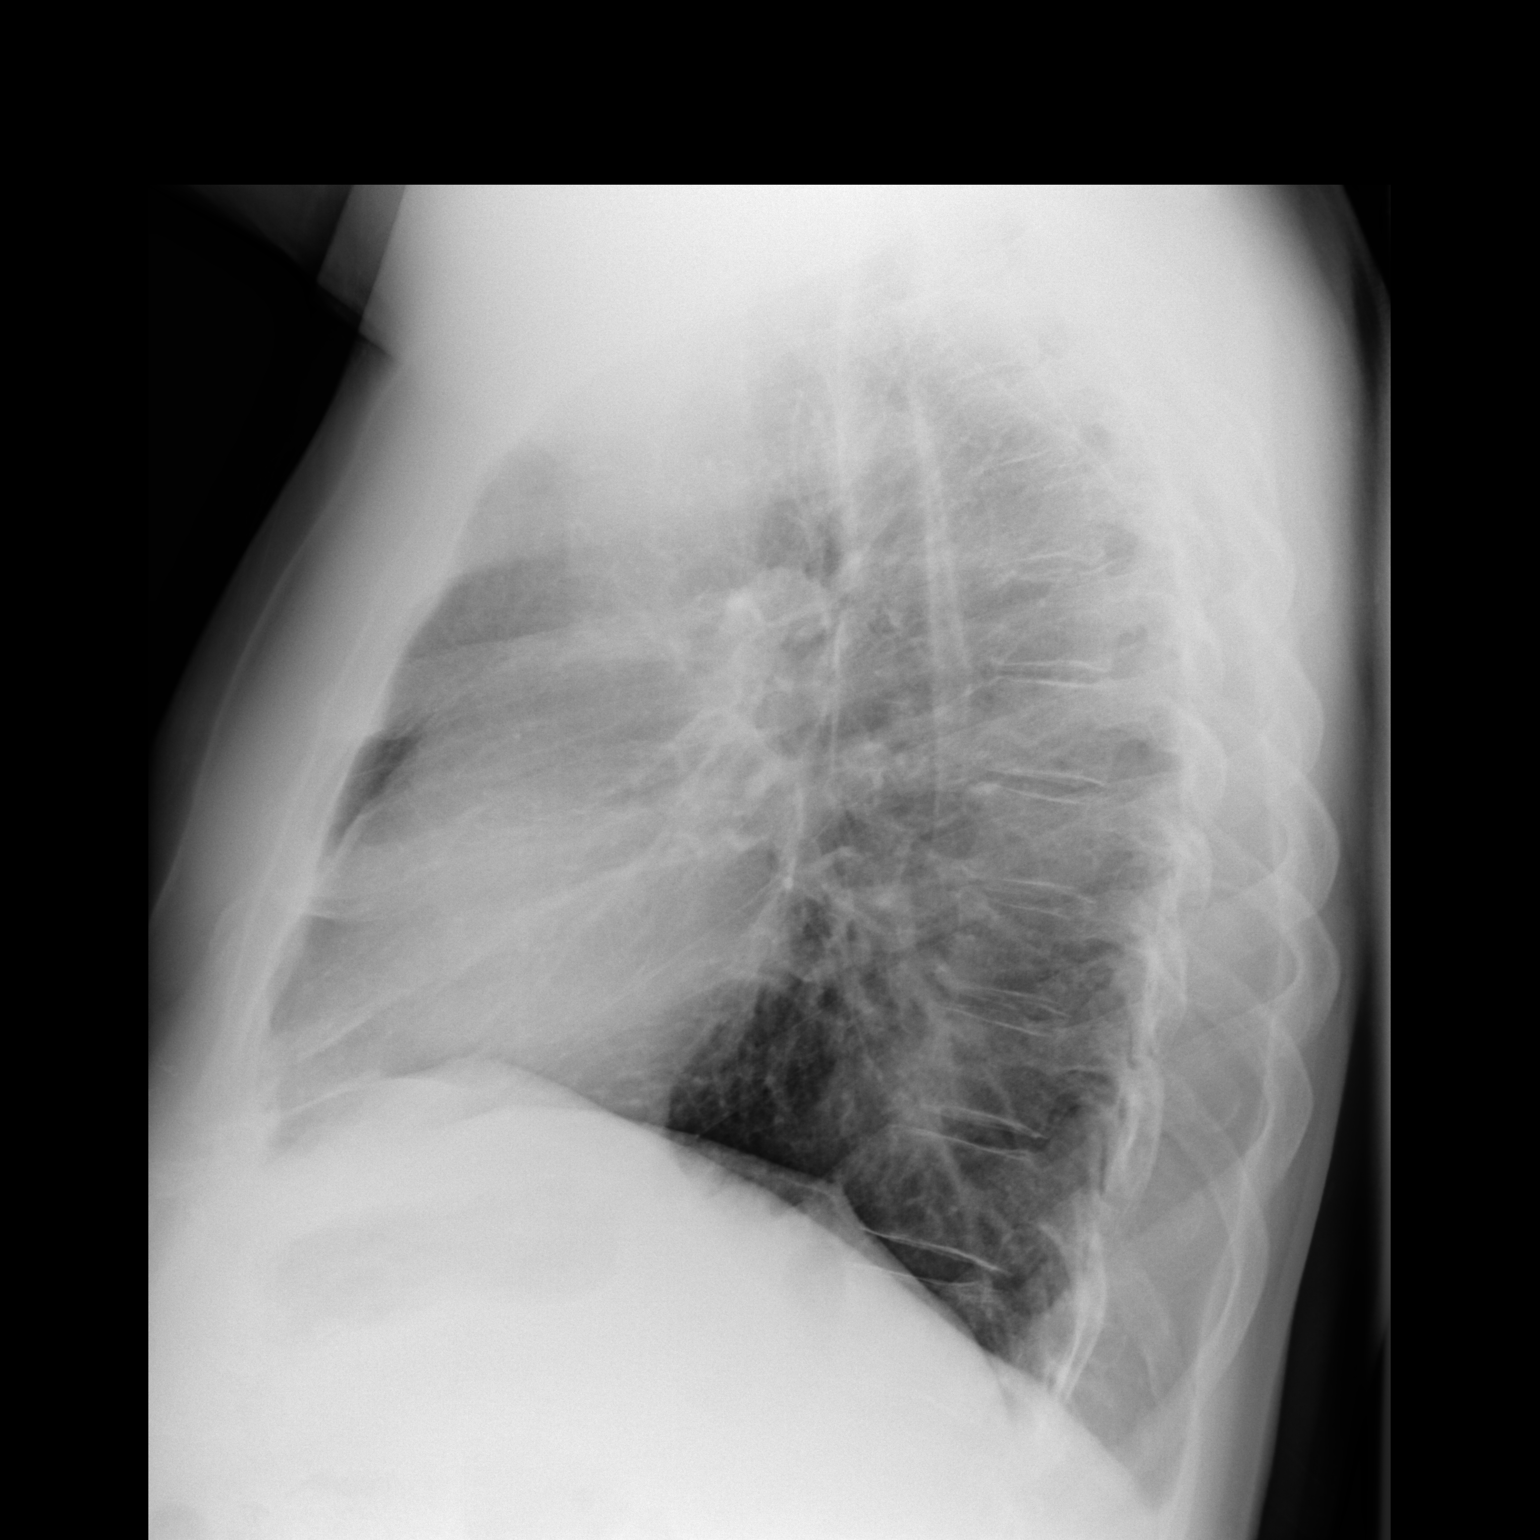

[2 of 2 positions shown; findings below may reference images not displayed]

FINDINGS: The heart size and mediastinal contours are within normal limits.
Both lungs are clear. The visualized skeletal structures are
unremarkable.
IMPRESSION: No active cardiopulmonary disease.

## 2023-08-30 DIAGNOSIS — R7309 Other abnormal glucose: Secondary | ICD-10-CM | POA: Diagnosis not present

## 2023-08-30 DIAGNOSIS — Z125 Encounter for screening for malignant neoplasm of prostate: Secondary | ICD-10-CM | POA: Diagnosis not present

## 2023-08-30 DIAGNOSIS — Z0000 Encounter for general adult medical examination without abnormal findings: Secondary | ICD-10-CM | POA: Diagnosis not present

## 2023-08-30 DIAGNOSIS — Z9081 Acquired absence of spleen: Secondary | ICD-10-CM | POA: Diagnosis not present

## 2023-08-30 DIAGNOSIS — E785 Hyperlipidemia, unspecified: Secondary | ICD-10-CM | POA: Diagnosis not present

## 2023-08-30 DIAGNOSIS — R7303 Prediabetes: Secondary | ICD-10-CM | POA: Diagnosis not present

## 2023-08-30 DIAGNOSIS — Z23 Encounter for immunization: Secondary | ICD-10-CM | POA: Diagnosis not present

## 2023-08-30 DIAGNOSIS — G4733 Obstructive sleep apnea (adult) (pediatric): Secondary | ICD-10-CM | POA: Diagnosis not present

## 2023-08-30 DIAGNOSIS — I10 Essential (primary) hypertension: Secondary | ICD-10-CM | POA: Diagnosis not present

## 2023-08-30 DIAGNOSIS — H3530 Unspecified macular degeneration: Secondary | ICD-10-CM | POA: Diagnosis not present

## 2023-08-30 DIAGNOSIS — E669 Obesity, unspecified: Secondary | ICD-10-CM | POA: Diagnosis not present

## 2023-08-30 DIAGNOSIS — G629 Polyneuropathy, unspecified: Secondary | ICD-10-CM | POA: Diagnosis not present

## 2023-08-30 DIAGNOSIS — Z1331 Encounter for screening for depression: Secondary | ICD-10-CM | POA: Diagnosis not present

## 2023-09-29 DIAGNOSIS — Z23 Encounter for immunization: Secondary | ICD-10-CM | POA: Diagnosis not present

## 2023-10-10 DIAGNOSIS — H5213 Myopia, bilateral: Secondary | ICD-10-CM | POA: Diagnosis not present

## 2023-10-10 DIAGNOSIS — H353132 Nonexudative age-related macular degeneration, bilateral, intermediate dry stage: Secondary | ICD-10-CM | POA: Diagnosis not present

## 2023-10-10 DIAGNOSIS — H43813 Vitreous degeneration, bilateral: Secondary | ICD-10-CM | POA: Diagnosis not present

## 2023-10-10 DIAGNOSIS — H33301 Unspecified retinal break, right eye: Secondary | ICD-10-CM | POA: Diagnosis not present

## 2023-10-10 DIAGNOSIS — H524 Presbyopia: Secondary | ICD-10-CM | POA: Diagnosis not present

## 2023-10-10 DIAGNOSIS — H2513 Age-related nuclear cataract, bilateral: Secondary | ICD-10-CM | POA: Diagnosis not present

## 2023-11-13 DIAGNOSIS — G4733 Obstructive sleep apnea (adult) (pediatric): Secondary | ICD-10-CM | POA: Diagnosis not present

## 2024-01-09 DIAGNOSIS — S46111A Strain of muscle, fascia and tendon of long head of biceps, right arm, initial encounter: Secondary | ICD-10-CM | POA: Diagnosis not present

## 2024-01-14 DIAGNOSIS — M25511 Pain in right shoulder: Secondary | ICD-10-CM | POA: Diagnosis not present

## 2024-01-14 DIAGNOSIS — M66829 Spontaneous rupture of other tendons, unspecified upper arm: Secondary | ICD-10-CM | POA: Diagnosis not present

## 2024-01-14 DIAGNOSIS — M25611 Stiffness of right shoulder, not elsewhere classified: Secondary | ICD-10-CM | POA: Diagnosis not present

## 2024-01-16 DIAGNOSIS — M66821 Spontaneous rupture of other tendons, right upper arm: Secondary | ICD-10-CM | POA: Diagnosis not present

## 2024-01-16 DIAGNOSIS — M25511 Pain in right shoulder: Secondary | ICD-10-CM | POA: Diagnosis not present

## 2024-01-16 DIAGNOSIS — M25611 Stiffness of right shoulder, not elsewhere classified: Secondary | ICD-10-CM | POA: Diagnosis not present

## 2024-01-21 DIAGNOSIS — M25611 Stiffness of right shoulder, not elsewhere classified: Secondary | ICD-10-CM | POA: Diagnosis not present

## 2024-01-21 DIAGNOSIS — M66821 Spontaneous rupture of other tendons, right upper arm: Secondary | ICD-10-CM | POA: Diagnosis not present

## 2024-01-21 DIAGNOSIS — M25511 Pain in right shoulder: Secondary | ICD-10-CM | POA: Diagnosis not present

## 2024-01-23 DIAGNOSIS — M25611 Stiffness of right shoulder, not elsewhere classified: Secondary | ICD-10-CM | POA: Diagnosis not present

## 2024-01-23 DIAGNOSIS — M25511 Pain in right shoulder: Secondary | ICD-10-CM | POA: Diagnosis not present

## 2024-01-23 DIAGNOSIS — M66821 Spontaneous rupture of other tendons, right upper arm: Secondary | ICD-10-CM | POA: Diagnosis not present

## 2024-01-28 DIAGNOSIS — M25511 Pain in right shoulder: Secondary | ICD-10-CM | POA: Diagnosis not present

## 2024-01-28 DIAGNOSIS — M25611 Stiffness of right shoulder, not elsewhere classified: Secondary | ICD-10-CM | POA: Diagnosis not present

## 2024-01-28 DIAGNOSIS — M66821 Spontaneous rupture of other tendons, right upper arm: Secondary | ICD-10-CM | POA: Diagnosis not present

## 2024-01-30 DIAGNOSIS — M25611 Stiffness of right shoulder, not elsewhere classified: Secondary | ICD-10-CM | POA: Diagnosis not present

## 2024-01-30 DIAGNOSIS — M25511 Pain in right shoulder: Secondary | ICD-10-CM | POA: Diagnosis not present

## 2024-01-30 DIAGNOSIS — M66829 Spontaneous rupture of other tendons, unspecified upper arm: Secondary | ICD-10-CM | POA: Diagnosis not present

## 2024-01-31 DIAGNOSIS — S46111A Strain of muscle, fascia and tendon of long head of biceps, right arm, initial encounter: Secondary | ICD-10-CM | POA: Diagnosis not present

## 2024-01-31 DIAGNOSIS — M25511 Pain in right shoulder: Secondary | ICD-10-CM | POA: Diagnosis not present

## 2024-02-01 DIAGNOSIS — M25511 Pain in right shoulder: Secondary | ICD-10-CM | POA: Diagnosis not present

## 2024-02-13 DIAGNOSIS — M19011 Primary osteoarthritis, right shoulder: Secondary | ICD-10-CM | POA: Diagnosis not present

## 2024-02-13 DIAGNOSIS — M75121 Complete rotator cuff tear or rupture of right shoulder, not specified as traumatic: Secondary | ICD-10-CM | POA: Diagnosis not present

## 2024-02-13 DIAGNOSIS — S46111A Strain of muscle, fascia and tendon of long head of biceps, right arm, initial encounter: Secondary | ICD-10-CM | POA: Diagnosis not present

## 2024-02-26 DIAGNOSIS — M179 Osteoarthritis of knee, unspecified: Secondary | ICD-10-CM | POA: Diagnosis not present

## 2024-02-26 DIAGNOSIS — E785 Hyperlipidemia, unspecified: Secondary | ICD-10-CM | POA: Diagnosis not present

## 2024-02-26 DIAGNOSIS — R7303 Prediabetes: Secondary | ICD-10-CM | POA: Diagnosis not present

## 2024-02-26 DIAGNOSIS — I10 Essential (primary) hypertension: Secondary | ICD-10-CM | POA: Diagnosis not present

## 2024-02-26 DIAGNOSIS — M7051 Other bursitis of knee, right knee: Secondary | ICD-10-CM | POA: Diagnosis not present

## 2024-02-27 DIAGNOSIS — M179 Osteoarthritis of knee, unspecified: Secondary | ICD-10-CM | POA: Diagnosis not present

## 2024-03-24 DIAGNOSIS — M24111 Other articular cartilage disorders, right shoulder: Secondary | ICD-10-CM | POA: Diagnosis not present

## 2024-03-24 DIAGNOSIS — G8918 Other acute postprocedural pain: Secondary | ICD-10-CM | POA: Diagnosis not present

## 2024-03-24 DIAGNOSIS — S43431D Superior glenoid labrum lesion of right shoulder, subsequent encounter: Secondary | ICD-10-CM | POA: Diagnosis not present

## 2024-03-24 DIAGNOSIS — Y999 Unspecified external cause status: Secondary | ICD-10-CM | POA: Diagnosis not present

## 2024-03-24 DIAGNOSIS — M25711 Osteophyte, right shoulder: Secondary | ICD-10-CM | POA: Diagnosis not present

## 2024-03-24 DIAGNOSIS — M75121 Complete rotator cuff tear or rupture of right shoulder, not specified as traumatic: Secondary | ICD-10-CM | POA: Diagnosis not present

## 2024-03-24 DIAGNOSIS — S46111D Strain of muscle, fascia and tendon of long head of biceps, right arm, subsequent encounter: Secondary | ICD-10-CM | POA: Diagnosis not present

## 2024-03-24 DIAGNOSIS — S46011A Strain of muscle(s) and tendon(s) of the rotator cuff of right shoulder, initial encounter: Secondary | ICD-10-CM | POA: Diagnosis not present

## 2024-03-24 DIAGNOSIS — M19011 Primary osteoarthritis, right shoulder: Secondary | ICD-10-CM | POA: Diagnosis not present

## 2024-03-24 DIAGNOSIS — X58XXXA Exposure to other specified factors, initial encounter: Secondary | ICD-10-CM | POA: Diagnosis not present

## 2024-03-24 DIAGNOSIS — M94211 Chondromalacia, right shoulder: Secondary | ICD-10-CM | POA: Diagnosis not present

## 2024-03-24 DIAGNOSIS — S46111A Strain of muscle, fascia and tendon of long head of biceps, right arm, initial encounter: Secondary | ICD-10-CM | POA: Diagnosis not present

## 2024-03-24 DIAGNOSIS — S43431A Superior glenoid labrum lesion of right shoulder, initial encounter: Secondary | ICD-10-CM | POA: Diagnosis not present

## 2024-03-27 DIAGNOSIS — M25511 Pain in right shoulder: Secondary | ICD-10-CM | POA: Diagnosis not present

## 2024-04-01 DIAGNOSIS — M25511 Pain in right shoulder: Secondary | ICD-10-CM | POA: Diagnosis not present

## 2024-04-03 DIAGNOSIS — M25511 Pain in right shoulder: Secondary | ICD-10-CM | POA: Diagnosis not present

## 2024-04-08 DIAGNOSIS — M25511 Pain in right shoulder: Secondary | ICD-10-CM | POA: Diagnosis not present

## 2024-04-11 DIAGNOSIS — M25511 Pain in right shoulder: Secondary | ICD-10-CM | POA: Diagnosis not present

## 2024-04-15 DIAGNOSIS — M25511 Pain in right shoulder: Secondary | ICD-10-CM | POA: Diagnosis not present

## 2024-04-17 DIAGNOSIS — M25511 Pain in right shoulder: Secondary | ICD-10-CM | POA: Diagnosis not present

## 2024-04-22 DIAGNOSIS — M25511 Pain in right shoulder: Secondary | ICD-10-CM | POA: Diagnosis not present

## 2024-04-24 DIAGNOSIS — M25511 Pain in right shoulder: Secondary | ICD-10-CM | POA: Diagnosis not present

## 2024-04-29 DIAGNOSIS — M25511 Pain in right shoulder: Secondary | ICD-10-CM | POA: Diagnosis not present

## 2024-05-01 DIAGNOSIS — M25511 Pain in right shoulder: Secondary | ICD-10-CM | POA: Diagnosis not present

## 2024-05-06 DIAGNOSIS — M25511 Pain in right shoulder: Secondary | ICD-10-CM | POA: Diagnosis not present

## 2024-05-08 DIAGNOSIS — M25511 Pain in right shoulder: Secondary | ICD-10-CM | POA: Diagnosis not present

## 2024-05-13 DIAGNOSIS — M25511 Pain in right shoulder: Secondary | ICD-10-CM | POA: Diagnosis not present

## 2024-05-19 DIAGNOSIS — M25511 Pain in right shoulder: Secondary | ICD-10-CM | POA: Diagnosis not present

## 2024-05-27 DIAGNOSIS — M25511 Pain in right shoulder: Secondary | ICD-10-CM | POA: Diagnosis not present

## 2024-06-03 DIAGNOSIS — M25511 Pain in right shoulder: Secondary | ICD-10-CM | POA: Diagnosis not present

## 2024-06-10 DIAGNOSIS — M25511 Pain in right shoulder: Secondary | ICD-10-CM | POA: Diagnosis not present

## 2024-09-02 DIAGNOSIS — Z0000 Encounter for general adult medical examination without abnormal findings: Secondary | ICD-10-CM | POA: Diagnosis not present

## 2024-09-02 DIAGNOSIS — E785 Hyperlipidemia, unspecified: Secondary | ICD-10-CM | POA: Diagnosis not present

## 2024-09-02 DIAGNOSIS — Z9081 Acquired absence of spleen: Secondary | ICD-10-CM | POA: Diagnosis not present

## 2024-09-02 DIAGNOSIS — Z7189 Other specified counseling: Secondary | ICD-10-CM | POA: Diagnosis not present

## 2024-09-02 DIAGNOSIS — Z1331 Encounter for screening for depression: Secondary | ICD-10-CM | POA: Diagnosis not present

## 2024-09-02 DIAGNOSIS — G4733 Obstructive sleep apnea (adult) (pediatric): Secondary | ICD-10-CM | POA: Diagnosis not present

## 2024-09-02 DIAGNOSIS — I10 Essential (primary) hypertension: Secondary | ICD-10-CM | POA: Diagnosis not present

## 2024-09-02 DIAGNOSIS — Z125 Encounter for screening for malignant neoplasm of prostate: Secondary | ICD-10-CM | POA: Diagnosis not present

## 2024-09-02 DIAGNOSIS — M179 Osteoarthritis of knee, unspecified: Secondary | ICD-10-CM | POA: Diagnosis not present

## 2024-09-02 DIAGNOSIS — R7303 Prediabetes: Secondary | ICD-10-CM | POA: Diagnosis not present

## 2024-10-14 DIAGNOSIS — H43813 Vitreous degeneration, bilateral: Secondary | ICD-10-CM | POA: Diagnosis not present

## 2024-10-14 DIAGNOSIS — H353132 Nonexudative age-related macular degeneration, bilateral, intermediate dry stage: Secondary | ICD-10-CM | POA: Diagnosis not present

## 2024-10-14 DIAGNOSIS — H2513 Age-related nuclear cataract, bilateral: Secondary | ICD-10-CM | POA: Diagnosis not present

## 2024-10-14 DIAGNOSIS — H5213 Myopia, bilateral: Secondary | ICD-10-CM | POA: Diagnosis not present

## 2024-10-14 DIAGNOSIS — H33301 Unspecified retinal break, right eye: Secondary | ICD-10-CM | POA: Diagnosis not present

## 2024-10-14 DIAGNOSIS — H524 Presbyopia: Secondary | ICD-10-CM | POA: Diagnosis not present

## 2024-10-14 DIAGNOSIS — H52203 Unspecified astigmatism, bilateral: Secondary | ICD-10-CM | POA: Diagnosis not present

## 2024-11-13 DIAGNOSIS — G4733 Obstructive sleep apnea (adult) (pediatric): Secondary | ICD-10-CM | POA: Diagnosis not present

## 2024-11-13 DIAGNOSIS — I10 Essential (primary) hypertension: Secondary | ICD-10-CM | POA: Diagnosis not present

## 2024-11-13 DIAGNOSIS — F5112 Insufficient sleep syndrome: Secondary | ICD-10-CM | POA: Diagnosis not present
